# Patient Record
Sex: Male | Born: 1985 | Hispanic: Yes | Marital: Married | Smoking: Never smoker
Health system: Southern US, Community
[De-identification: ages and names within clinical notes are randomized; demographics above are authoritative.]

## PROBLEM LIST (undated history)

## (undated) HISTORY — PX: FRACTURE SURGERY: SHX138

---

## 2016-02-09 ENCOUNTER — Ambulatory Visit (HOSPITAL_COMMUNITY)
Admission: EM | Admit: 2016-02-09 | Discharge: 2016-02-09 | Disposition: A | Discharge status: Home / Self Care | Payer: Self-pay | Attending: Internal Medicine | Admitting: Internal Medicine

## 2016-02-09 ENCOUNTER — Encounter (HOSPITAL_COMMUNITY): Payer: Self-pay | Admitting: Emergency Medicine

## 2016-02-09 DIAGNOSIS — M545 Low back pain, unspecified: Secondary | ICD-10-CM

## 2016-02-09 MED ORDER — CYCLOBENZAPRINE HCL 10 MG PO TABS: 10.0000 mg | ORAL_TABLET | Freq: Two times a day (BID) | ORAL | 0 refills | Status: AC | PRN

## 2016-02-09 MED ORDER — MELOXICAM 7.5 MG PO TABS: ORAL_TABLET | ORAL | 0 refills | Status: AC

## 2016-02-09 NOTE — Discharge Instructions (Signed)
Meloxicam (Mobic) is an antiinflammatory to help with pain and inflammation.  Do not take ibuprofen, Advil, Aleve, or any other medications that contain NSAIDs while taking meloxicam as this may cause stomach upset or even ulcers if taken in large amounts for an extended period of time.  ° °Flexeril is a muscle relaxer and may cause drowsiness. Do not drink alcohol, drive, or operate heavy machinery while taking. ° ° °

## 2016-02-09 NOTE — ED Provider Notes (Signed)
CSN: [id number]     Arrival date & time 02/09/16  1744 History   First MD Initiated Contact with Patient 02/09/16 1840     Chief Complaint  Patient presents with  . Back Pain   (Consider location/radiation/quality/duration/timing/severity/associated sxs/prior Treatment) HPI Alvin Cox is a 30 y.o. male presenting to UC with c/o Left lower intermittent back pain for about 1 month that occurs mainly at night when he is lying down. Pain is cramping and tight, sharp at times. Does not radiate down leg.  Pain is 2/10 at this time. He has not tried anything for pain as it is intermittent.  Denies known injury but notes he does a lot of twisting, bending and lifting at work. No heavy lifting. No prior back problems.   History reviewed. No pertinent past medical history. Past Surgical History:  Procedure Laterality Date  . FRACTURE SURGERY     right humerus   No family history on file. Social History  Substance Use Topics  . Smoking status: Never Smoker  . Smokeless tobacco: Never Used  . Alcohol use No    Review of Systems  Genitourinary: Negative for dysuria, flank pain, frequency and hematuria.  Musculoskeletal: Positive for back pain and myalgias. Negative for arthralgias, gait problem, joint swelling, neck pain and neck stiffness.  Skin: Negative for color change and rash.  Neurological: Negative for weakness and numbness.    Allergies  Review of patient's allergies indicates no known allergies.  Home Medications   Prior to Admission medications   Medication Sig Start Date End Date Taking? Authorizing Provider  cyclobenzaprine (FLEXERIL) 10 MG tablet Take 1 tablet (10 mg total) by mouth 2 (two) times daily as needed for muscle spasms. 02/09/16   Junius Finner, PA-C  meloxicam (MOBIC) 7.5 MG tablet Take 2 tabs (15mg ) daily for 7 days, then take 1-2 tabs daily as needed for pain. 02/09/16   Junius Finner, PA-C   Meds Ordered and Administered this Visit  Medications - No  data to display  BP 133/82 (BP Location: Left Arm)   Pulse 87   Temp 98.9 F (37.2 C) (Oral)   Resp 16   Ht 6\' 1"  (1.854 m)   Wt 280 lb (127 kg)   SpO2 100%   BMI 36.94 kg/m  No data found.   Physical Exam  Constitutional: He is oriented to person, place, and time. He appears well-developed and well-nourished.  HENT:  Head: Normocephalic and atraumatic.  Eyes: EOM are normal.  Neck: Normal range of motion.  Cardiovascular: Normal rate.   Pulmonary/Chest: Effort normal.  Musculoskeletal: Normal range of motion. He exhibits tenderness. He exhibits no edema.  No midline spinal tenderness. Mild tenderness to Left lumbar muscles. Full ROM upper and lower extremities with 5/5 strength. Negative straight leg raise.  Neurological: He is alert and oriented to person, place, and time.  Skin: Skin is warm and dry. Capillary refill takes less than 2 seconds. No rash noted. No erythema.  Psychiatric: He has a normal mood and affect. His behavior is normal.  Nursing note and vitals reviewed.   Urgent Care Course   Clinical Course    Procedures (including critical care time)  Labs Review Labs Reviewed - No data to display  Imaging Review No results found.   MDM   1. Acute left-sided low back pain without sciatica    Pt c/o Left lower back pain w/o known injury. No red flag symptoms. No bony tenderness. No indication for imaging at this time.  Pain likely due to back muscle strain.  Rx: Flexeril and Meloxicam  Home care instructions provided. F/u with PCP or [facility] in 1-2 weeks if not improving.    Junius FinnerErin O'Malley, PA-C 02/09/16 [phone]

## 2016-02-09 NOTE — ED Triage Notes (Signed)
Pt reports left lower back pain for 1 month. PT works [employer] and does a lot of lifting and bending. PT reports pain is worst when laying flat or on his side.

## 2016-04-10 ENCOUNTER — Emergency Department (HOSPITAL_COMMUNITY): Payer: Self-pay

## 2016-04-10 ENCOUNTER — Emergency Department (HOSPITAL_COMMUNITY)
Admission: EM | Admit: 2016-04-10 | Discharge: 2016-04-10 | Disposition: A | Discharge status: Home / Self Care | Payer: Self-pay | Attending: Emergency Medicine | Admitting: Emergency Medicine

## 2016-04-10 ENCOUNTER — Encounter (HOSPITAL_COMMUNITY): Payer: Self-pay | Admitting: Emergency Medicine

## 2016-04-10 DIAGNOSIS — Y999 Unspecified external cause status: Secondary | ICD-10-CM | POA: Insufficient documentation

## 2016-04-10 DIAGNOSIS — R55 Syncope and collapse: Secondary | ICD-10-CM

## 2016-04-10 DIAGNOSIS — Y939 Activity, unspecified: Secondary | ICD-10-CM | POA: Insufficient documentation

## 2016-04-10 DIAGNOSIS — Y92002 Bathroom of unspecified non-institutional (private) residence single-family (private) house as the place of occurrence of the external cause: Secondary | ICD-10-CM | POA: Insufficient documentation

## 2016-04-10 DIAGNOSIS — X58XXXA Exposure to other specified factors, initial encounter: Secondary | ICD-10-CM | POA: Insufficient documentation

## 2016-04-10 DIAGNOSIS — S01512A Laceration without foreign body of oral cavity, initial encounter: Secondary | ICD-10-CM | POA: Insufficient documentation

## 2016-04-10 DIAGNOSIS — Z79899 Other long term (current) drug therapy: Secondary | ICD-10-CM | POA: Insufficient documentation

## 2016-04-10 LAB — BASIC METABOLIC PANEL
Anion gap: 10 (ref 5–15)
BUN: 12 mg/dL (ref 6–20)
CHLORIDE: 101 mmol/L (ref 101–111)
CO2: 26 mmol/L (ref 22–32)
CREATININE: 0.64 mg/dL (ref 0.61–1.24)
Calcium: 9.3 mg/dL (ref 8.9–10.3)
GFR calc non Af Amer: >60 mL/min (ref >60)
GLUCOSE: 88 mg/dL (ref 65–99)
Potassium: 3.8 mmol/L (ref 3.5–5.1)
Sodium: 137 mmol/L (ref 135–145)

## 2016-04-10 LAB — CBC
HCT: 45.5 % (ref 39.0–52.0)
Hemoglobin: 15.5 g/dL (ref 13.0–17.0)
MCH: 29.9 pg (ref 26.0–34.0)
MCHC: 34.1 g/dL (ref 30.0–36.0)
MCV: 87.8 fL (ref 78.0–100.0)
PLATELETS: 312 10*3/uL (ref 150–400)
RBC: 5.18 MIL/uL (ref 4.22–5.81)
RDW: 13.8 % (ref 11.5–15.5)
WBC: 14.8 10*3/uL — ABNORMAL HIGH (ref 4.0–10.5)

## 2016-04-10 LAB — RAPID URINE DRUG SCREEN, HOSP PERFORMED
Amphetamines: NOT DETECTED
BARBITURATES: NOT DETECTED
BENZODIAZEPINES: NOT DETECTED
COCAINE: NOT DETECTED
Opiates: NOT DETECTED
TETRAHYDROCANNABINOL: NOT DETECTED

## 2016-04-10 LAB — URINALYSIS, ROUTINE W REFLEX MICROSCOPIC
BILIRUBIN URINE: NEGATIVE
GLUCOSE, UA: NEGATIVE mg/dL
Hgb urine dipstick: NEGATIVE
Ketones, ur: NEGATIVE mg/dL
LEUKOCYTES UA: NEGATIVE
NITRITE: NEGATIVE
PH: 5 (ref 5.0–8.0)
Protein, ur: NEGATIVE mg/dL
SPECIFIC GRAVITY, URINE: 1.015 (ref 1.005–1.030)

## 2016-04-10 LAB — I-STAT TROPONIN, ED: Troponin i, poc: 0 ng/mL (ref 0.00–0.08)

## 2016-04-10 NOTE — Discharge Instructions (Addendum)
You have been seen today following a syncopal episode. There are no significant abnormalities noted on your lab work and imaging. It is recommended that you follow up with a primary care provider on this matter. You should also request a repeat EKG to assure there are no changes from the EKGs we obtained here. Return to the ED should symptoms recur.  You should also follow up with cardiology for a possible Holter monitor.

## 2016-04-10 NOTE — ED Triage Notes (Signed)
Pt here for possible syncopal episode; pt sts woke up on ground after  Getting up to walk to bathroom; pt denies any dizziness or other sx and has been at baseline today

## 2016-04-10 NOTE — ED Provider Notes (Signed)
[facility]-EMERGENCY DEPT Provider Note   CSN: [id number] Arrival date & time: 04/10/16  1648     History   Chief Complaint Chief Complaint  Patient presents with  . Loss of Consciousness    HPI Alvin Cox is a 30 y.o. male.  HPI   Alvin Cox is a 30 y.o. male, patient with no pertinent past medical history, presenting to the ED with A syncopal episode That occurred this morning. Patient states he walked into the bathroom to urinate and woke up on the bathroom floor. Patient states prior to this he had turned the heat up in the house, wrapped himself in a blanket, and was sweating as well as feeling chilled. He estimates he was unconscious for a couple minutes. When he awoke, he drank some water and "felt normal." This has never happened to him before. He denies new medications or changes in routine. Denies recent illness. Denies incontinence. He does endorse a small tongue laceration. Denies fever, N/V/D, headaches, chest pain, shortness of breath, dizziness, neuro deficits, or any other complaints.      History reviewed. No pertinent past medical history.  There are no active problems to display for this patient.   Past Surgical History:  Procedure Laterality Date  . FRACTURE SURGERY     right humerus       Home Medications    Prior to Admission medications   Medication Sig Start Date End Date Taking? Authorizing Provider  cyclobenzaprine (FLEXERIL) 10 MG tablet Take 1 tablet (10 mg total) by mouth 2 (two) times daily as needed for muscle spasms. 02/09/16   Junius FinnerErin O'Malley, PA-C  meloxicam (MOBIC) 7.5 MG tablet Take 2 tabs (15mg ) daily for 7 days, then take 1-2 tabs daily as needed for pain. 02/09/16   Junius FinnerErin O'Malley, PA-C    Family History History reviewed. No pertinent family history.  Social History Social History  Substance Use Topics  . Smoking status: Never Smoker  . Smokeless tobacco: Never Used  . Alcohol use No     Allergies     Patient has no known allergies.   Review of Systems Review of Systems  Constitutional: Positive for chills. Negative for fever.  Respiratory: Negative for cough and shortness of breath.   Cardiovascular: Negative for chest pain.  Gastrointestinal: Negative for abdominal pain, diarrhea, nausea and vomiting.  Musculoskeletal: Negative for back pain and neck pain.  Neurological: Positive for syncope. Negative for dizziness, weakness, light-headedness, numbness and headaches.  All other systems reviewed and are negative.    Physical Exam Updated Vital Signs BP 144/81 (BP Location: Right Arm)   Pulse 81   Temp 98.6 F (37 C) (Oral)   Resp 16   SpO2 100%   Physical Exam  Constitutional: He is oriented to person, place, and time. He appears well-developed and well-nourished. No distress.  HENT:  Head: Normocephalic.  Very small abrasion versus laceration noted to the tip of the tongue. No active hemorrhage. No noted trauma to the patient's face or scalp.  Eyes: Conjunctivae and EOM are normal. Pupils are equal, round, and reactive to light.  Neck: Normal range of motion. Neck supple.  Cardiovascular: Normal rate, regular rhythm, normal heart sounds and intact distal pulses.   Pulmonary/Chest: Effort normal and breath sounds normal. No respiratory distress. He exhibits no tenderness.  Abdominal: Soft. There is no tenderness. There is no guarding.  Musculoskeletal: He exhibits no edema.  Normal motor function intact in all extremities and spine. No midline spinal tenderness.  Lymphadenopathy:    He has no cervical adenopathy.  Neurological: He is alert and oriented to person, place, and time.  No sensory deficits. Strength 5/5 in all extremities. No gait disturbance. Coordination intact including heel to shin and finger to nose. Cranial nerves III-XII grossly intact. No facial droop.   Skin: Skin is warm and dry. He is not diaphoretic.  Psychiatric: He has a normal mood and affect.  His behavior is normal.  Nursing note and vitals reviewed.    ED Treatments / Results  Labs (all labs ordered are listed, but only abnormal results are displayed) Labs Reviewed  CBC - Abnormal; Notable for the following:       Result Value   WBC 14.8 (*)    All other components within normal limits  BASIC METABOLIC PANEL  URINALYSIS, ROUTINE W REFLEX MICROSCOPIC  RAPID URINE DRUG SCREEN, HOSP PERFORMED  I-STAT TROPOININ, ED    EKG  EKG Interpretation  Date/Time:  Wednesday April 10 2016 17:58:20 EST Ventricular Rate:  94 PR Interval:  122 QRS Duration: 102 QT Interval:  328 QTC Calculation: 410 R Axis:   -22 Text Interpretation:  Normal sinus rhythm Cannot rule out Anterior infarct , age undetermined Abnormal ECG Confirmed by Lincoln Brigham [phone]) on 04/10/2016 8:07:25 PM       EKG Interpretation  Date/Time:  Wednesday April 10 2016 21:07:56 EST Ventricular Rate:  91 PR Interval:  114 QRS Duration: 100 QT Interval:  348 QTC Calculation: 428 R Axis:   -9 Text Interpretation:  Normal sinus rhythm with sinus arrhythmia Cannot rule out Anterior infarct , age undetermined Abnormal ECG Confirmed by Lincoln Brigham [phone]) on 04/10/2016 9:24:57 PM       Radiology Dg Chest 2 View  Result Date: 04/10/2016 CLINICAL DATA:  Acute onset of syncope. Dehydration. Initial encounter. EXAM: CHEST  2 VIEW COMPARISON:  None. FINDINGS: The lungs are well-aerated. Pulmonary vascularity is at the upper limits of normal. There is no evidence of focal opacification, pleural effusion or pneumothorax. The heart is normal in size; the mediastinal contour is within normal limits. No acute osseous abnormalities are seen. IMPRESSION: No acute cardiopulmonary process seen. Electronically Signed   By: Roanna Raider M.D.   On: 04/10/2016 21:38    Procedures Procedures (including critical care time)  Medications Ordered in ED Medications - No data to display   Initial Impression / Assessment  and Plan / ED Course  I have reviewed the triage vital signs and the nursing notes.  Pertinent labs & imaging results that were available during my care of the patient were reviewed by me and considered in my medical decision making (see chart for details).  Clinical Course     Patient presents following a syncopal episode this morning. Patient's lab work and imaging results were encouraging. Patient is symptom-free and has been since the event. No neuro or functional deficits. EKG abnormality shows no change or progression. May be a normal variant. No signs of congenital heart abnormality, such as Brugada. Patient to follow up with PCP for repeat evaluation and repeat EKG. Return precautions discussed. Patient voiced understanding of all instructions and is comfortable with discharge.  Findings and plan of care discussed with Tilden Fossa, MD. Dr. Madilyn Hook personally evaluated and examined this patient.  Vitals:   04/10/16 1756 04/10/16 1943  BP: 139/90 144/81  Pulse: 94 81  Resp: 18 16  Temp: 98.4 F (36.9 C) 98.6 F (37 C)  TempSrc: Oral Oral  SpO2: 100% 100%  Orthostatic VS for the past 24 hrs:  BP- Lying Pulse- Lying BP- Sitting Pulse- Sitting BP- Standing at 0 minutes Pulse- Standing at 0 minutes  04/10/16 2053 129/71 88 129/71 93 125/80 100      Final Clinical Impressions(s) / ED Diagnoses   Final diagnoses:  Syncope, unspecified syncope type    New Prescriptions Discharge Medication List as of 04/10/2016 10:13 PM       Anselm PancoastShawn C Joy, PA-C 04/10/16 2311    Tilden FossaElizabeth Rees, MD 04/12/16 [phone]

## 2016-04-10 NOTE — ED Notes (Signed)
Pt going to xray  

## 2016-04-10 NOTE — ED Notes (Signed)
Pt departed in NAD, refused use of wheelchair.  

## 2016-04-10 NOTE — ED Notes (Signed)
Pt back from x-ray.

## 2018-06-03 IMAGING — DX DG CHEST 2V
2 series · 2 of 2 positions shown · non-contrast
Comparison: None.

CLINICAL DATA: Acute onset of syncope. Dehydration. Initial
encounter.

EXAM:
CHEST  2 VIEW

[chest pa]
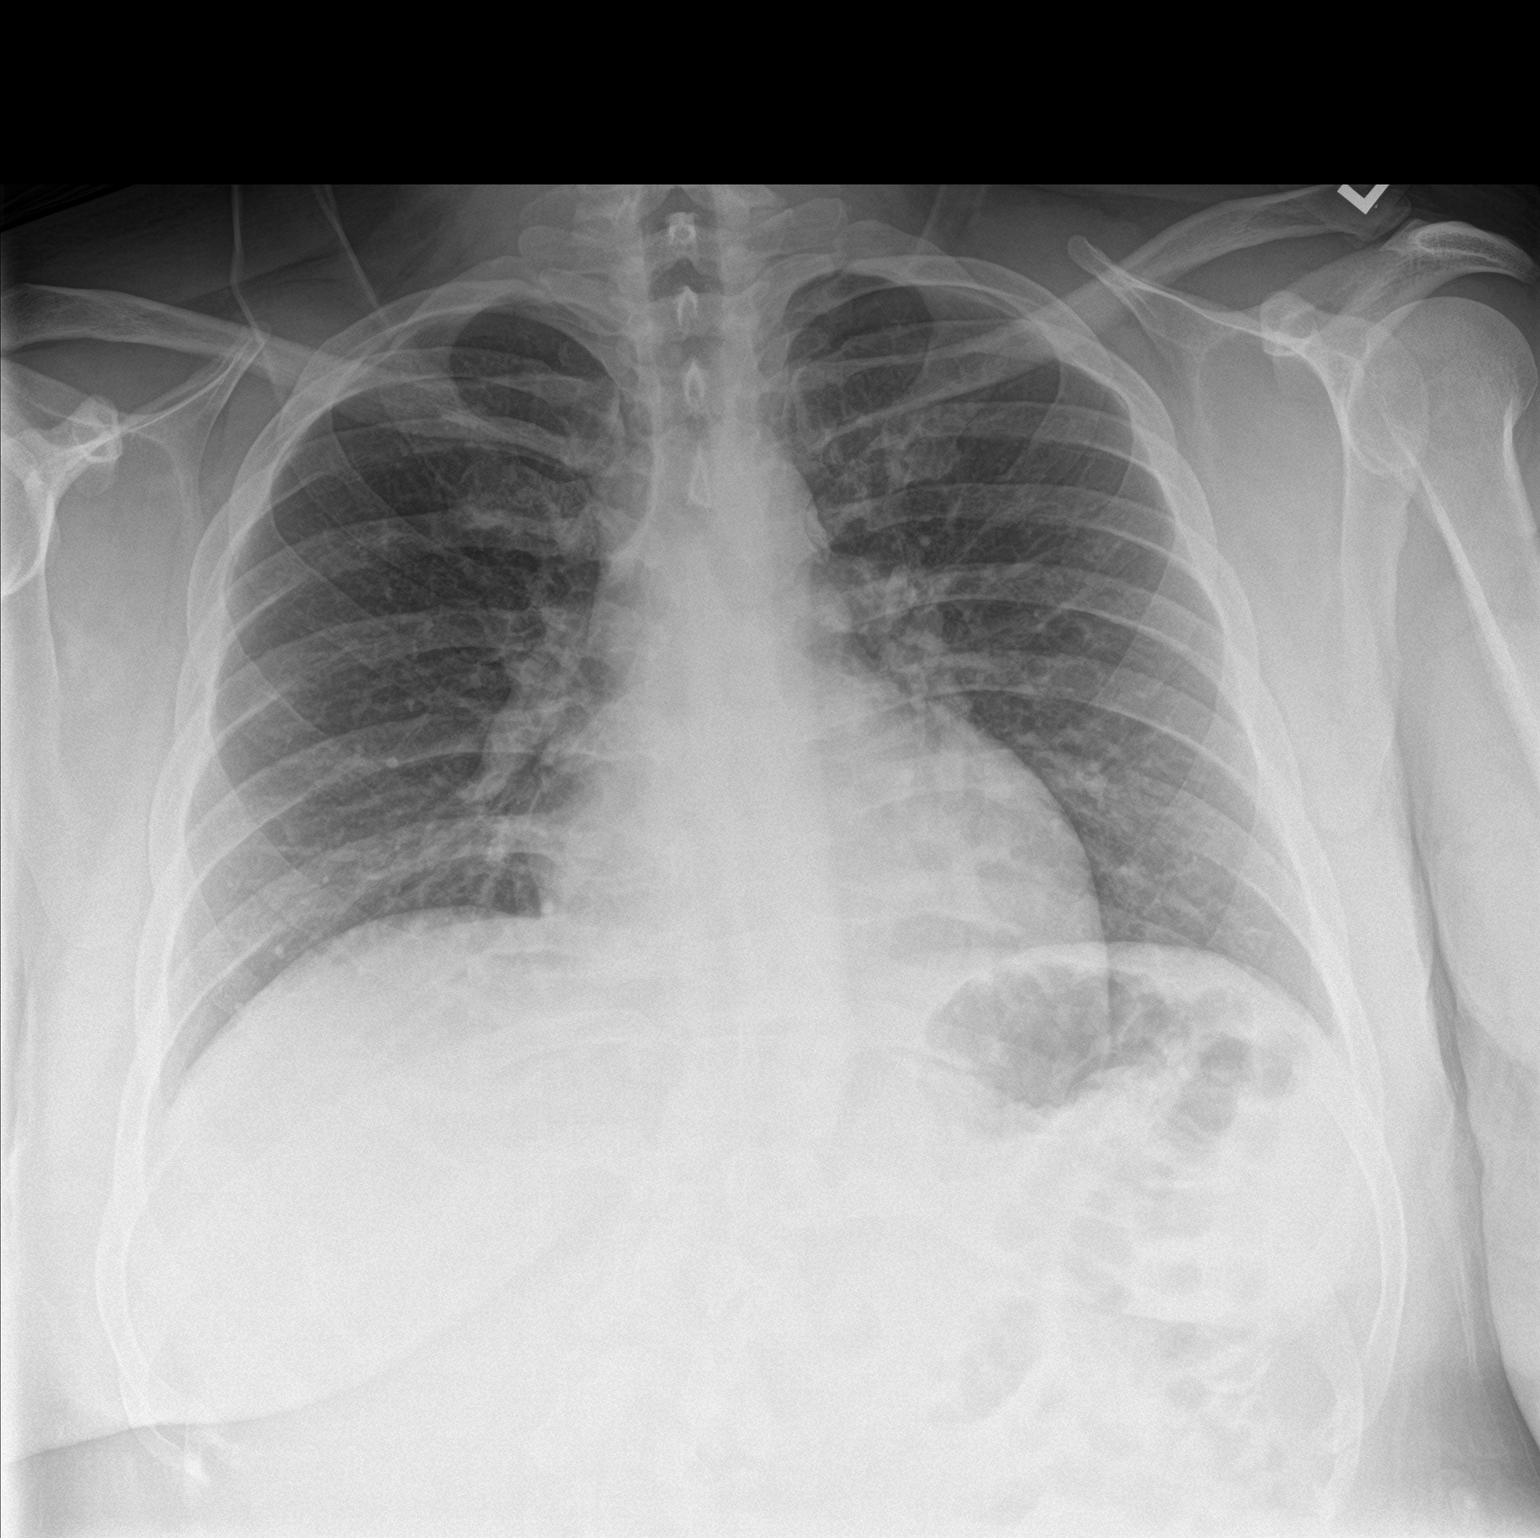

[chest lat]
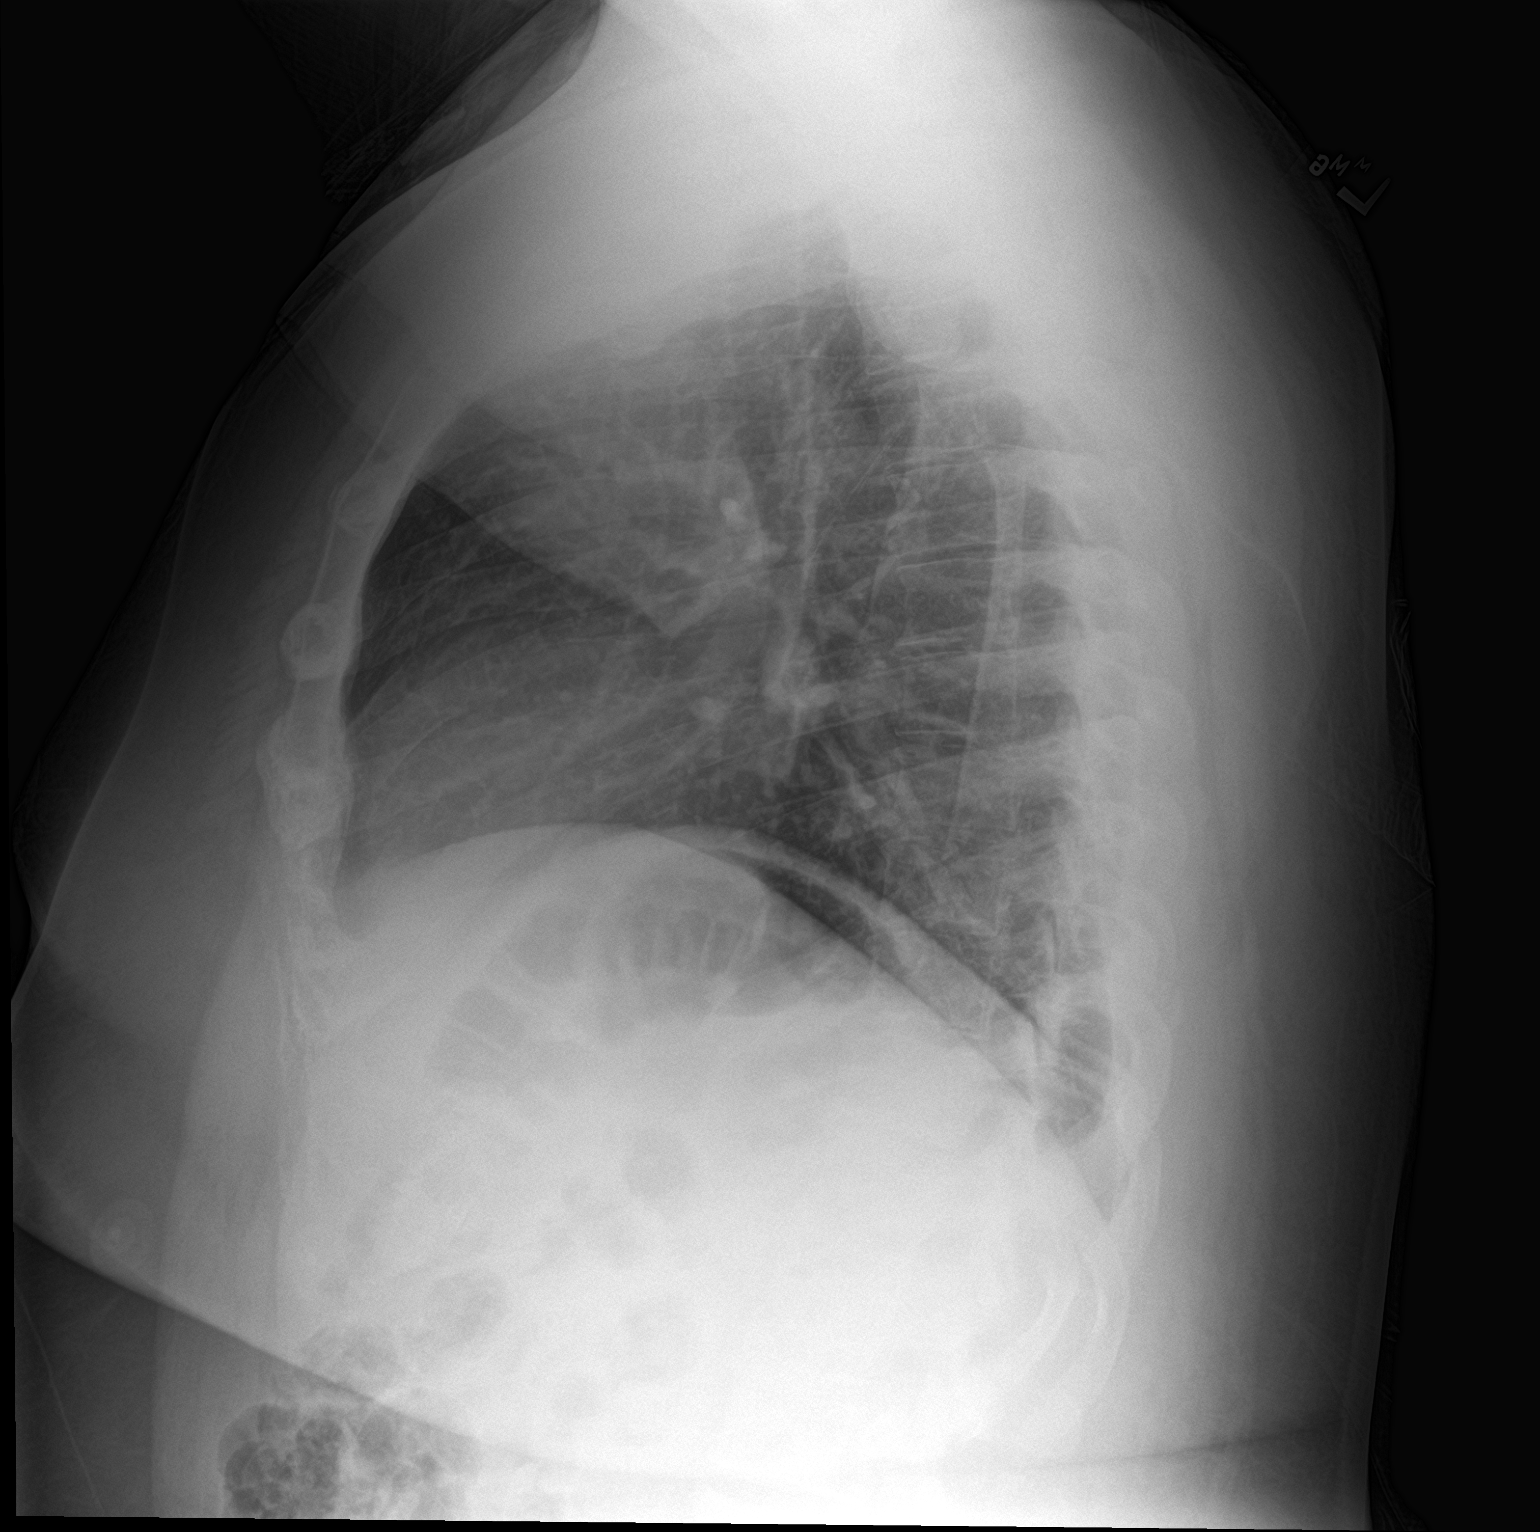

[2 of 2 positions shown; findings below may reference images not displayed]

FINDINGS: The lungs are well-aerated. Pulmonary vascularity is at the upper
limits of normal. There is no evidence of focal opacification,
pleural effusion or pneumothorax.

The heart is normal in size; the mediastinal contour is within
normal limits. No acute osseous abnormalities are seen.
IMPRESSION: No acute cardiopulmonary process seen.

## 2022-06-17 ENCOUNTER — Other Ambulatory Visit: Payer: Self-pay

## 2022-06-17 ENCOUNTER — Emergency Department (HOSPITAL_COMMUNITY): Payer: Self-pay

## 2022-06-17 ENCOUNTER — Emergency Department (HOSPITAL_COMMUNITY)
Admission: EM | Admit: 2022-06-17 | Discharge: 2022-06-17 | Disposition: A | Discharge status: Home / Self Care | Payer: Self-pay | Attending: Emergency Medicine | Admitting: Emergency Medicine

## 2022-06-17 ENCOUNTER — Encounter (HOSPITAL_COMMUNITY): Payer: Self-pay

## 2022-06-17 DIAGNOSIS — K529 Noninfective gastroenteritis and colitis, unspecified: Secondary | ICD-10-CM | POA: Insufficient documentation

## 2022-06-17 LAB — URINALYSIS, ROUTINE W REFLEX MICROSCOPIC
Bacteria, UA: NONE SEEN
Bilirubin Urine: NEGATIVE
Glucose, UA: NEGATIVE mg/dL
Ketones, ur: NEGATIVE mg/dL
Leukocytes,Ua: NEGATIVE
Nitrite: NEGATIVE
Protein, ur: NEGATIVE mg/dL
Specific Gravity, Urine: 1.024 (ref 1.005–1.030)
pH: 5 (ref 5.0–8.0)

## 2022-06-17 LAB — COMPREHENSIVE METABOLIC PANEL
ALT: 26 U/L (ref 0–44)
AST: 17 U/L (ref 15–41)
Albumin: 3.5 g/dL (ref 3.5–5.0)
Alkaline Phosphatase: 82 U/L (ref 38–126)
Anion gap: 10 (ref 5–15)
BUN: 10 mg/dL (ref 6–20)
CO2: 26 mmol/L (ref 22–32)
Calcium: 8.9 mg/dL (ref 8.9–10.3)
Chloride: 101 mmol/L (ref 98–111)
Creatinine, Ser: 0.89 mg/dL (ref 0.61–1.24)
GFR, Estimated: >60 mL/min (ref >60)
Glucose, Bld: 98 mg/dL (ref 70–99)
Potassium: 4 mmol/L (ref 3.5–5.1)
Sodium: 137 mmol/L (ref 135–145)
Total Bilirubin: 0.4 mg/dL (ref 0.3–1.2)
Total Protein: 7.7 g/dL (ref 6.5–8.1)

## 2022-06-17 LAB — CBC WITH DIFFERENTIAL/PLATELET
Abs Immature Granulocytes: 0.11 10*3/uL — ABNORMAL HIGH (ref 0.00–0.07)
Basophils Absolute: 0.1 10*3/uL (ref 0.0–0.1)
Basophils Relative: 0 %
Eosinophils Absolute: 0.2 10*3/uL (ref 0.0–0.5)
Eosinophils Relative: 1 %
HCT: 44.4 % (ref 39.0–52.0)
Hemoglobin: 14.8 g/dL (ref 13.0–17.0)
Immature Granulocytes: 1 %
Lymphocytes Relative: 11 %
Lymphs Abs: 2 10*3/uL (ref 0.7–4.0)
MCH: 30 pg (ref 26.0–34.0)
MCHC: 33.3 g/dL (ref 30.0–36.0)
MCV: 90.1 fL (ref 80.0–100.0)
Monocytes Absolute: 1.6 10*3/uL — ABNORMAL HIGH (ref 0.1–1.0)
Monocytes Relative: 9 %
Neutro Abs: 14.5 10*3/uL — ABNORMAL HIGH (ref 1.7–7.7)
Neutrophils Relative %: 78 %
Platelets: 359 10*3/uL (ref 150–400)
RBC: 4.93 MIL/uL (ref 4.22–5.81)
RDW: 13 % (ref 11.5–15.5)
WBC: 18.5 10*3/uL — ABNORMAL HIGH (ref 4.0–10.5)
nRBC: 0 % (ref 0.0–0.2)

## 2022-06-17 LAB — LIPASE, BLOOD: Lipase: 29 U/L (ref 11–51)

## 2022-06-17 MED ORDER — METRONIDAZOLE 500 MG PO TABS: 500.0000 mg | ORAL_TABLET | Freq: Two times a day (BID) | ORAL | 0 refills | Status: AC

## 2022-06-17 MED ORDER — PANTOPRAZOLE SODIUM 40 MG IV SOLR
40.0000 mg | Freq: Once | INTRAVENOUS | Status: AC
  Administered 2022-06-17: 40 mg via INTRAVENOUS
  Filled 2022-06-17: qty 10

## 2022-06-17 MED ORDER — IOHEXOL 300 MG/ML  SOLN
100.0000 mL | Freq: Once | INTRAMUSCULAR | Status: AC | PRN
  Administered 2022-06-17: 100 mL via INTRAVENOUS

## 2022-06-17 MED ORDER — CIPROFLOXACIN HCL 500 MG PO TABS: ORAL_TABLET | ORAL | 0 refills | Status: AC

## 2022-06-17 MED ORDER — DICYCLOMINE HCL 20 MG PO TABS: ORAL_TABLET | ORAL | 0 refills | Status: AC

## 2022-06-17 NOTE — Discharge Instructions (Signed)
Follow-up with Dr. Paulita Fujita at [facility] or you can see one of his colleagues in the next couple weeks.  Return if any problems

## 2022-06-17 NOTE — ED Triage Notes (Signed)
Pt states that he was sick last week with abd pain, got better, and is now having periumbilical and pain since Thursday. No N/V/D

## 2022-06-17 NOTE — ED Provider Triage Note (Signed)
Emergency Medicine Provider Triage Evaluation Note  Alvin Cox , a 37 y.o. male  was evaluated in triage.  Pt complains of abdominal pain Pt reports vomiting and diarrhea   Review of Systems  Positive: vomiting Negative: fever  Physical Exam  BP (!) 156/98 (BP Location: Left Arm)   Pulse 96   Temp 98.6 F (37 C) (Oral)   Resp 18   Ht 6' (1.829 m)   Wt 131.1 kg   SpO2 100%   BMI 39.20 kg/m  Gen:   Awake, no distress   Resp:  Normal effort  MSK:   Moves extremities without difficulty  Other:  Abdominal diffusely tender    Medical Decision Making  Medically screening exam initiated at 3:51 PM.  Appropriate orders placed.  Alvin Cox was informed that the remainder of the evaluation will be completed by another provider, this initial triage assessment does not replace that evaluation, and the importance of remaining in the ED until their evaluation is complete.     Fransico Meadow, [place] 06/17/22 1554

## 2022-06-17 NOTE — ED Provider Notes (Signed)
[facility] Provider Note   CSN: [id number] Arrival date & time: 06/17/22  1536     History {Add pertinent medical, surgical, social history, OB history to HPI:1} Chief Complaint  Patient presents with   Abdominal Pain    Alvin Cox is a 37 y.o. male.  Patient complains of lower abdominal pain for few days.  Patient has no past medical history    Abdominal Pain      Home Medications Prior to Admission medications   Medication Sig Start Date End Date Taking? Authorizing Provider  ciprofloxacin (CIPRO) 500 MG tablet One po bid 06/17/22  Yes Milton Ferguson, MD  dicyclomine (BENTYL) 20 MG tablet Take 1 every 8 hours as needed for abdominal cramping 06/17/22  Yes Milton Ferguson, MD  metroNIDAZOLE (FLAGYL) 500 MG tablet Take 1 tablet (500 mg total) by mouth 2 (two) times daily. 06/17/22  Yes Milton Ferguson, MD  cyclobenzaprine (FLEXERIL) 10 MG tablet Take 1 tablet (10 mg total) by mouth 2 (two) times daily as needed for muscle spasms. 02/09/16   Noe Gens, PA-C  meloxicam (MOBIC) 7.5 MG tablet Take 2 tabs ('15mg'$ ) daily for 7 days, then take 1-2 tabs daily as needed for pain. 02/09/16   Noe Gens, PA-C      Allergies    Patient has no known allergies.    Review of Systems   Review of Systems  Gastrointestinal:  Positive for abdominal pain.    Physical Exam Updated Vital Signs BP 120/73 (BP Location: Left Arm)   Pulse 94   Temp 99 F (37.2 C) (Oral)   Resp 16   Ht 6' (1.829 m)   Wt 131.1 kg   SpO2 100%   BMI 39.20 kg/m  Physical Exam  ED Results / Procedures / Treatments   Labs (all labs ordered are listed, but only abnormal results are displayed) Labs Reviewed  CBC WITH DIFFERENTIAL/PLATELET - Abnormal; Notable for the following components:      Result Value   WBC 18.5 (*)    Neutro Abs 14.5 (*)    Monocytes Absolute 1.6 (*)    Abs Immature Granulocytes 0.11 (*)    All other components within  normal limits  URINALYSIS, ROUTINE W REFLEX MICROSCOPIC - Abnormal; Notable for the following components:   Hgb urine dipstick SMALL (*)    All other components within normal limits  COMPREHENSIVE METABOLIC PANEL  LIPASE, BLOOD    EKG None  Radiology CT ABDOMEN PELVIS W CONTRAST  Result Date: 06/17/2022 CLINICAL DATA:  Abdominal pain. EXAM: CT ABDOMEN AND PELVIS WITH CONTRAST TECHNIQUE: Multidetector CT imaging of the abdomen and pelvis was performed using the standard protocol following bolus administration of intravenous contrast. RADIATION DOSE REDUCTION: This exam was performed according to the departmental dose-optimization program which includes automated exposure control, adjustment of the mA and/or kV according to patient size and/or use of iterative reconstruction technique. CONTRAST:  122m OMNIPAQUE IOHEXOL 300 MG/ML  SOLN COMPARISON:  None Available. FINDINGS: Lower chest: No acute abnormality. Hepatobiliary: No focal liver abnormality is seen. No calcific gallstones, gallbladder wall thickening, or biliary dilatation. Pancreas: Unremarkable. No pancreatic ductal dilatation or surrounding inflammatory changes. Spleen: Normal in size without focal abnormality. Adrenals/Urinary Tract: Adrenal glands are unremarkable. Kidneys are normal, without renal calculi, focal lesion, or hydronephrosis. Bladder is unremarkable. Stomach/Bowel: The stomach again most of the small bowel are unremarkable. There is no small bowel obstruction. The terminal ileal segment is thickened and inflamed,  without pneumatosis. The appendix is normal. There is no large bowel wall thickening. There is uncomplicated colonic diverticulosis. Vascular/Lymphatic: There are no significant vascular findings. There are enlarged right lower quadrant mesenteric lymph nodes at the level of the inflamed terminal ileum, up to short axis measurement of 1.2 cm, and borderline prominent left periaortic chain nodes and periportal lymph  nodes. There is no inguinal or pelvic adenopathy. Reproductive: Prostate is unremarkable. Other: There is trace low-density ascites in the pelvis. There is no free air, free hemorrhage or abscess. There is a small umbilical fat hernia but no incarcerated hernias. Musculoskeletal: No acute osseous findings or focal destructive lesion. There is moderate lumbar dextrorotary scoliosis and mild degenerative change of the lumbar spine. IMPRESSION: 1. Thickened and inflamed terminal ileal segment consistent with ileitis. This could be due to infection or inflammatory bowel disease. 2. No small bowel obstruction or free air. 3. Trace low-density ascites in the pelvis. 4. Right lower quadrant mesenteric adenopathy and borderline prominent left periaortic and periportal lymph nodes. Etiology could be reactive or neoplastic. 5. Diverticulosis without evidence of diverticulitis. 6. Umbilical fat hernia. 7. Lumbar dextrorotary scoliosis and degenerative change. Electronically Signed   By: Telford Nab M.D.   On: 06/17/2022 20:19    Procedures Procedures  {Document cardiac monitor, telemetry assessment procedure when appropriate:1}  Medications Ordered in ED Medications  pantoprazole (PROTONIX) injection 40 mg (40 mg Intravenous Given 06/17/22 1938)  iohexol (OMNIPAQUE) 300 MG/ML solution 100 mL (100 mLs Intravenous Contrast Given 06/17/22 1951)    ED Course/ Medical Decision Making/ A&P   {   Click here for ABCD2, HEART and other calculatorsREFRESH Note before signing :1}                          Medical Decision Making Risk Prescription drug management.   Patient with ileitis.  He is started on Cipro and Flagyl and given Bentyl and will follow-up with GI  {Document critical care time when appropriate:1} {Document review of labs and clinical decision tools ie heart score, Chads2Vasc2 etc:1}  {Document your independent review of radiology images, and any outside records:1} {Document your discussion with  family members, caretakers, and with consultants:1} {Document social determinants of health affecting pt's care:1} {Document your decision making why or why not admission, treatments were needed:1} Final Clinical Impression(s) / ED Diagnoses Final diagnoses:  Ileitis    Rx / DC Orders ED Discharge Orders          Ordered    ciprofloxacin (CIPRO) 500 MG tablet        06/17/22 2105    metroNIDAZOLE (FLAGYL) 500 MG tablet  2 times daily        06/17/22 2105    dicyclomine (BENTYL) 20 MG tablet        06/17/22 2105
